# Patient Record
Sex: Male | Born: 1970 | Race: Black or African American | Hispanic: No | Marital: Married | Smoking: Current every day smoker
Health system: Southern US, Community
[De-identification: ages and names within clinical notes are randomized; demographics above are authoritative.]

## PROBLEM LIST (undated history)

## (undated) DIAGNOSIS — S82899A Other fracture of unspecified lower leg, initial encounter for closed fracture: Secondary | ICD-10-CM

## (undated) HISTORY — DX: Other fracture of unspecified lower leg, initial encounter for closed fracture: S82.899A

---

## 2011-01-27 ENCOUNTER — Emergency Department: Payer: Self-pay | Admitting: Emergency Medicine

## 2012-06-22 IMAGING — CR DG ANKLE COMPLETE 3+V*L*
1 series · 5 of 5 positions shown · non-contrast
Comparison: none

REASON FOR EXAM: trauma
COMMENTS:

PROCEDURE:     DXR - DXR ANKLE LEFT COMPLETE  - January 27, 2011  [DATE]
RESULT:     Comparison: None

[Series 1: view not recorded · 0.17mm/px · 5 of 5 slices shown]
[im 1/5]
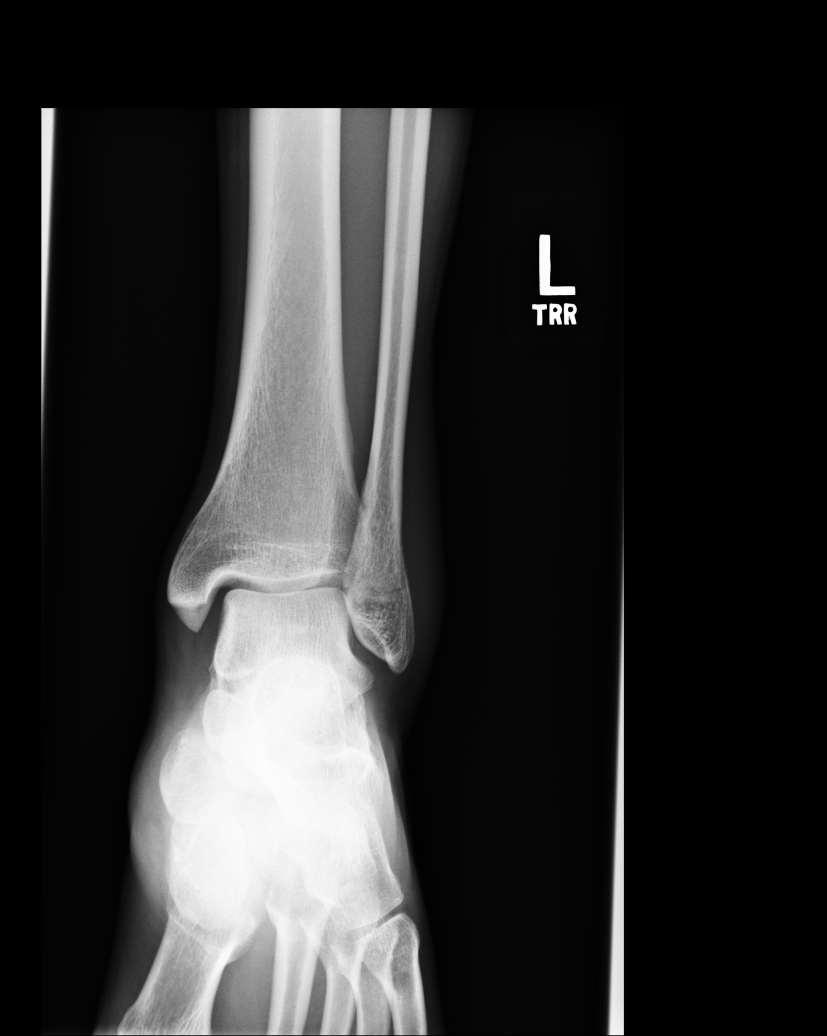
[im 2/5]
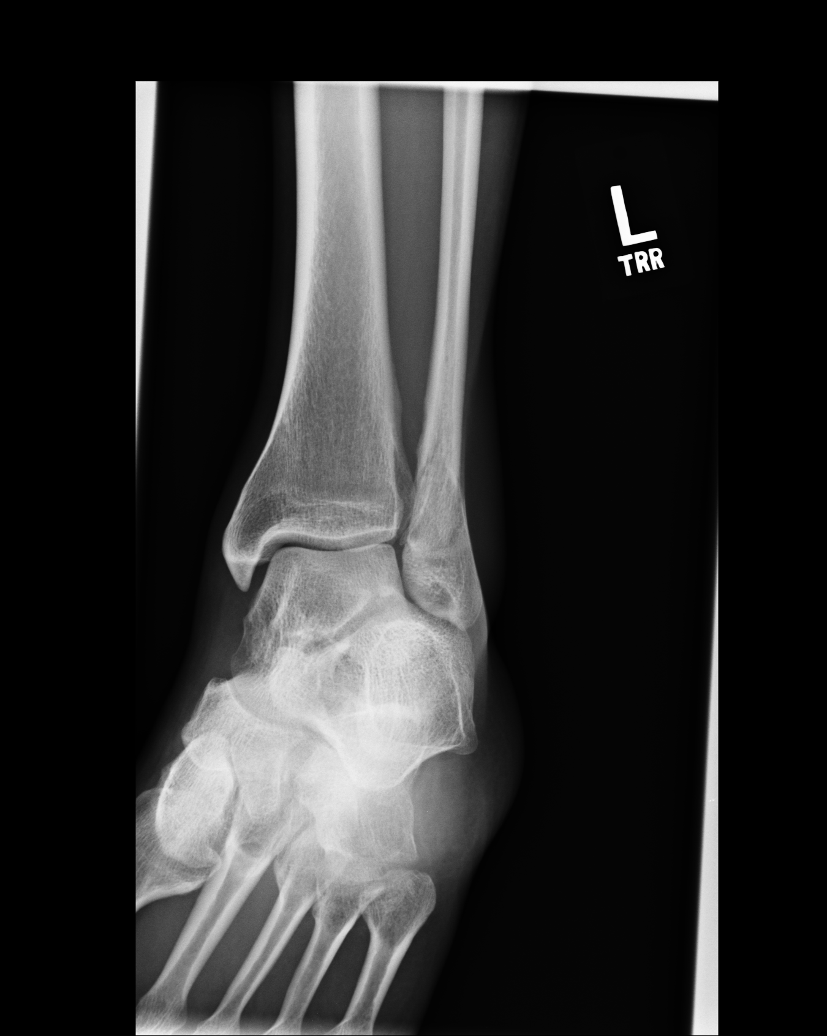
[im 3/5]
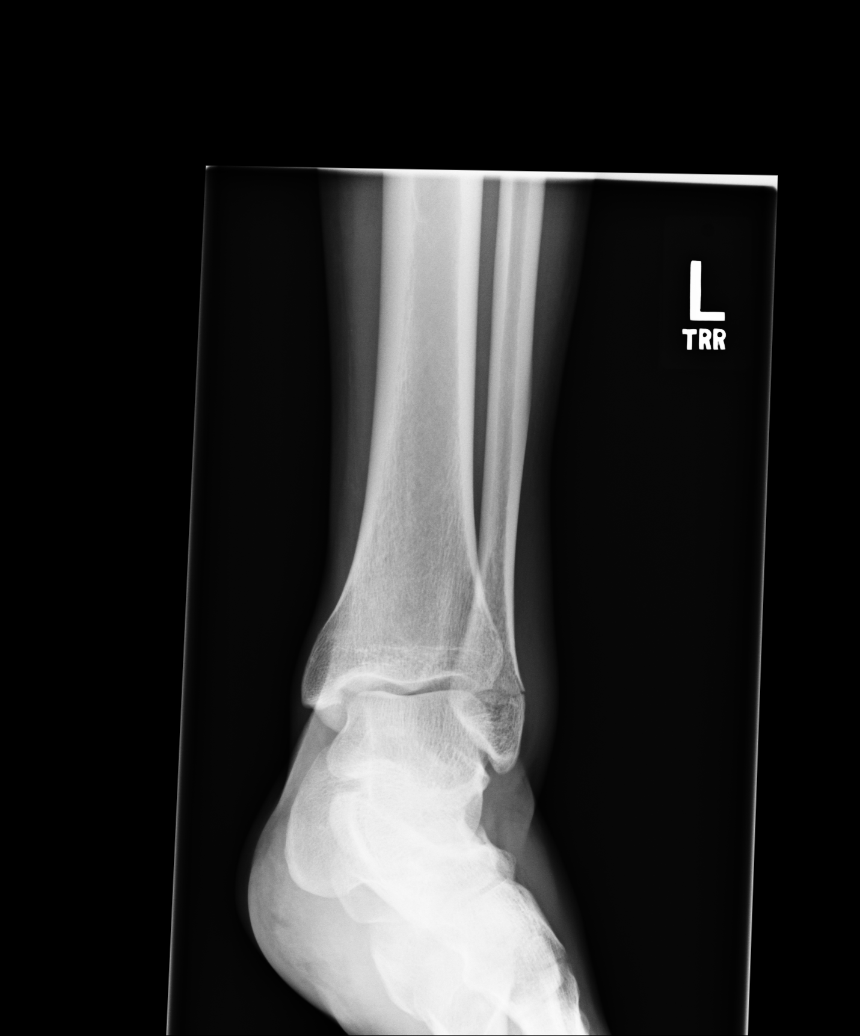
[im 4/5]
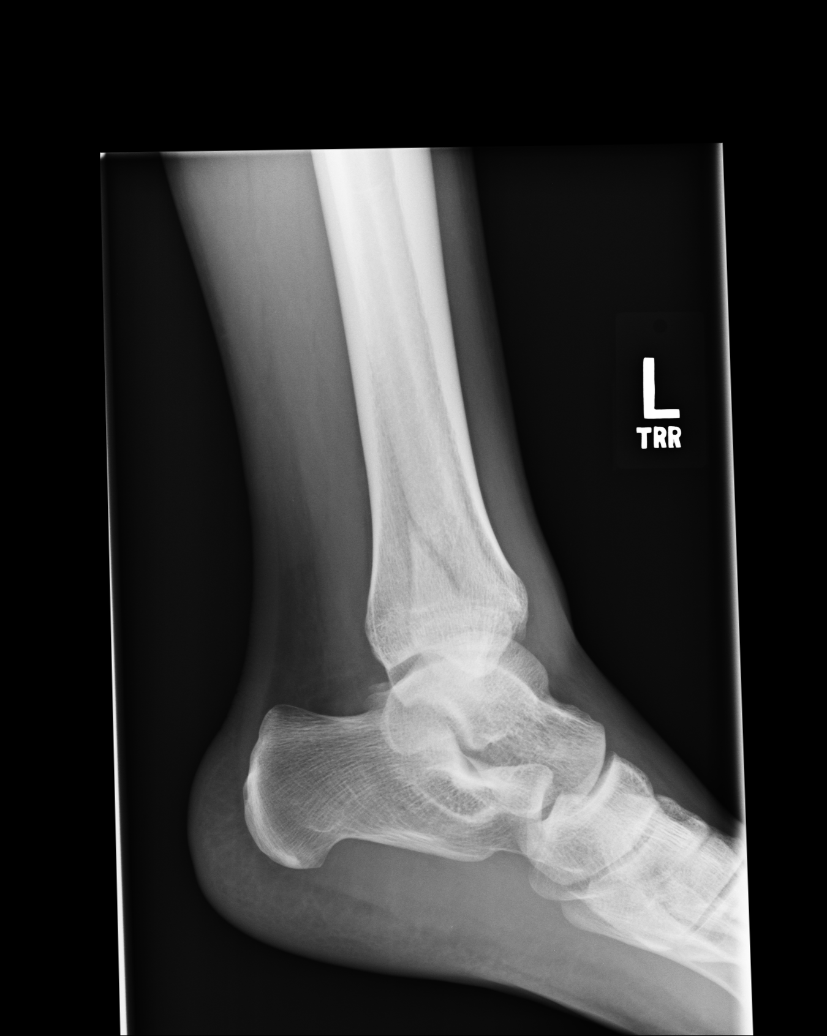
[im 5/5]
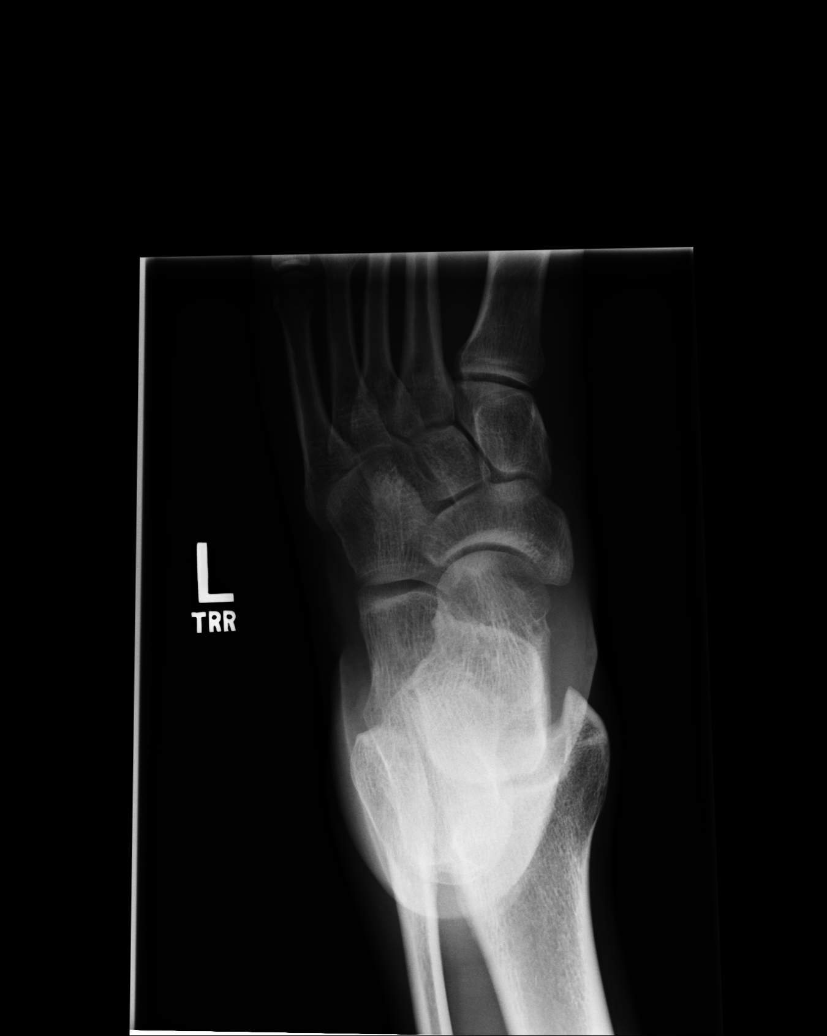

[5 of 5 positions shown; findings below may reference images not displayed]

FINDINGS: 5 views of the left ankle demonstrate a nondisplaced distal fibular fracture
and overlying soft tissue swelling. There is no other fracture or
dislocation. There ankle mortise is intact. There is no significant joint
effusion.
IMPRESSION: . Please see above.

## 2013-07-22 ENCOUNTER — Emergency Department: Payer: Self-pay | Admitting: Emergency Medicine

## 2014-09-17 ENCOUNTER — Emergency Department
Admission: EM | Admit: 2014-09-17 | Discharge: 2014-09-17 | Disposition: A | Discharge status: Home / Self Care | Payer: Self-pay | Attending: Obstetrics and Gynecology | Admitting: Obstetrics and Gynecology

## 2014-09-17 ENCOUNTER — Encounter: Payer: Self-pay | Admitting: General Practice

## 2014-09-17 DIAGNOSIS — Z720 Tobacco use: Secondary | ICD-10-CM | POA: Insufficient documentation

## 2014-09-17 DIAGNOSIS — L02416 Cutaneous abscess of left lower limb: Secondary | ICD-10-CM | POA: Insufficient documentation

## 2014-09-17 DIAGNOSIS — L02419 Cutaneous abscess of limb, unspecified: Secondary | ICD-10-CM

## 2014-09-17 DIAGNOSIS — Z880 Allergy status to penicillin: Secondary | ICD-10-CM | POA: Insufficient documentation

## 2014-09-17 MED ORDER — SULFAMETHOXAZOLE-TRIMETHOPRIM 800-160 MG PO TABS: 1.0000 | ORAL_TABLET | Freq: Two times a day (BID) | ORAL | Status: DC

## 2014-09-17 MED ORDER — HYDROCODONE-ACETAMINOPHEN 5-325 MG PO TABS: 1.0000 | ORAL_TABLET | ORAL | Status: DC | PRN

## 2014-09-17 NOTE — ED Notes (Signed)
Pt. Arrived to ED from home with reports of experiencing a abscess to back of left thigh. Pt reports site drained last night in bathtub. Denies fever at this time. Alert and Oriented.

## 2014-09-17 NOTE — ED Provider Notes (Signed)
[facility] Emergency Department Provider Note  ____________________________________________  Time seen: 1120  I have reviewed the triage vital signs and the nursing notes.   HISTORY  Chief Complaint Recurrent Skin Infections  HPI Heloise BeechamReginald M Dier is a 44 y.o. male comes today with complaint of abscess on  back this left thigh. He states it drained last night in the bathtub and a little bit this morning. He denies any fever. The last abscess he had was at least 9 or 10 years ago. He rates his pain as a 7 out of 10. Nothing has made it worse soaking in the child is actually made it better.   History reviewed. No pertinent past medical history.  There are no active problems to display for this patient.   History reviewed. No pertinent past surgical history.  Current Outpatient Rx  Name  Route  Sig  Dispense  Refill  . HYDROcodone-acetaminophen (NORCO/VICODIN) 5-325 MG per tablet   Oral   Take 1 tablet by mouth every 4 (four) hours as needed for moderate pain.   20 tablet   0   . sulfamethoxazole-trimethoprim (BACTRIM DS,SEPTRA DS) 800-160 MG per tablet   Oral   Take 1 tablet by mouth 2 (two) times daily.   20 tablet   0     Allergies Penicillins  No family history on file.  Social History History  Substance Use Topics  . Smoking status: Current Every Day Smoker -- 0.50 packs/day    Types: Cigarettes  . Smokeless tobacco: Current User  . Alcohol Use: 12.0 oz/week    20 Cans of beer per week    Review of Systems Constitutional: No fever/chills Eyes: No visual changes. ENT: No sore throat. Cardiovascular: Denies chest pain. Respiratory: Denies shortness of breath. Gastrointestinal: No abdominal pain.  No nausea, no vomiting. Genitourinary: Negative for dysuria. Musculoskeletal: Negative for back pain. Skin: Negative for rash. Neurological: Negative for headaches 10-point ROS otherwise  negative.  ____________________________________________   PHYSICAL EXAM:  VITAL SIGNS: ED Triage Vitals  Enc Vitals Group     BP 09/17/14 1105 118/76 mmHg     Pulse Rate 09/17/14 1105 70     Resp 09/17/14 1105 18     Temp 09/17/14 1105 98 F (36.7 C)     Temp Source 09/17/14 1105 Oral     SpO2 09/17/14 1105 100 %     Weight 09/17/14 1105 160 lb (72.576 kg)     Height 09/17/14 1105 5\' 10"  (1.778 m)     Head Cir --      Peak Flow --      Pain Score 09/17/14 1106 7     Pain Loc --      Pain Edu? --      Excl. in GC? --     Constitutional: Alert and oriented. Well appearing and in no acute distress. Eyes: Conjunctivae are normal. PERRL. EOMI. Head: Atraumatic. Nose: No congestion/rhinnorhea. Mouth/Throat: Mucous membranes are moist.  Neck: No stridor.    Cardiovascular: Normal rate, regular rhythm. Grossly normal heart sounds.  Good peripheral circulation. Respiratory: Normal respiratory effort.  Gastrointestinal: Soft and nontender. No distention. Musculoskeletal: No lower extremity tenderness nor edema.  No joint effusions. Neurologic:  Normal speech and language. No gross focal neurologic deficits are appreciated. Speech is normal. No gait instability. Skin:  Skin is warm, dry and intact. There is a dime-sized, warm erythematous area posterior left thigh. Skin is open there is no active draining at this time. Psychiatric:  Mood and affect are normal. Speech and behavior are normal.  ____________________________________________   LABS (all labs ordered are listed, but only abnormal results are displayed)  Labs Reviewed - No data to display  PROCEDURES  Procedure(s) performed: None  Critical Care performed: No  ____________________________________________   INITIAL IMPRESSION / ASSESSMENT AND PLAN / ED COURSE  Pertinent labs & imaging results that were available during my care of the patient were reviewed by me and considered in my medical decision making (see  chart for details).  Patient was encouraged to continue with warm soaks. He is started on antibiotics today for the next 10 days and something for pain. He was given a note for work stating he was seen here today. ____________________________________________   FINAL CLINICAL IMPRESSION(S) / ED DIAGNOSES  Final diagnoses:  Abscess of thigh      Tommi RumpsRhonda L Irmgard Rampersaud, PA-C 09/17/14 1136  Tommi Rumpshonda L Teirra Carapia, PA-C 09/17/14 1539  Phineas SemenGraydon Goodman, MD 09/22/14 [phone]

## 2015-12-01 ENCOUNTER — Ambulatory Visit (INDEPENDENT_AMBULATORY_CARE_PROVIDER_SITE_OTHER): Payer: BLUE CROSS/BLUE SHIELD | Admitting: Unknown Physician Specialty

## 2015-12-01 ENCOUNTER — Encounter: Payer: Self-pay | Admitting: Unknown Physician Specialty

## 2015-12-01 VITALS — BP 137/84 | HR 72 | Temp 98.6°F | Ht 67.7 in | Wt 147.6 lb

## 2015-12-01 DIAGNOSIS — Z0000 Encounter for general adult medical examination without abnormal findings: Secondary | ICD-10-CM | POA: Diagnosis not present

## 2015-12-01 DIAGNOSIS — Z720 Tobacco use: Secondary | ICD-10-CM | POA: Diagnosis not present

## 2015-12-01 NOTE — Patient Instructions (Addendum)
Quit smoking class [phone]   Steps to Quit Smoking  Smoking tobacco can be harmful to your health and can affect almost every organ in your body. Smoking puts you, and those around you, at risk for developing many serious chronic diseases. Quitting smoking is difficult, but it is one of the best things that you can do for your health. It is never too late to quit. WHAT ARE THE BENEFITS OF QUITTING SMOKING? When you quit smoking, you lower your risk of developing serious diseases and conditions, such as:  Lung cancer or lung disease, such as COPD.  Heart disease.  Stroke.  Heart attack.  Infertility.  Osteoporosis and bone fractures. Additionally, symptoms such as coughing, wheezing, and shortness of breath may get better when you quit. You may also find that you get sick less often because your body is stronger at fighting off colds and infections. If you are pregnant, quitting smoking can help to reduce your chances of having a baby of low birth weight. HOW DO I GET READY TO QUIT? When you decide to quit smoking, create a plan to make sure that you are successful. Before you quit:  Pick a date to quit. Set a date within the next two weeks to give you time to prepare.  Write down the reasons why you are quitting. Keep this list in places where you will see it often, such as on your bathroom mirror or in your car or wallet.  Identify the people, places, things, and activities that make you want to smoke (triggers) and avoid them. Make sure to take these actions:  Throw away all cigarettes at home, at work, and in your car.  Throw away smoking accessories, such as Set designer.  Clean your car and make sure to empty the ashtray.  Clean your home, including curtains and carpets.  Tell your family, friends, and coworkers that you are quitting. Support from your loved ones can make quitting easier.  Talk with your health care provider about your options for quitting  smoking.  Find out what treatment options are covered by your health insurance. WHAT STRATEGIES CAN I USE TO QUIT SMOKING?  Talk with your healthcare provider about different strategies to quit smoking. Some strategies include:  Quitting smoking altogether instead of gradually lessening how much you smoke over a period of time. Research shows that quitting "cold [place]" is more successful than gradually quitting.  Attending in-person counseling to help you build problem-solving skills. You are more likely to have success in quitting if you attend several counseling sessions. Even short sessions of 10 minutes can be effective.  Finding resources and support systems that can help you to quit smoking and remain smoke-free after you quit. These resources are most helpful when you use them often. They can include:  Online chats with a Veterinary surgeon.  Telephone quitlines.  Printed Materials engineer.  Support groups or group counseling.  Text messaging programs.  Mobile phone applications.  Taking medicines to help you quit smoking. (If you are pregnant or breastfeeding, talk with your health care provider first.) Some medicines contain nicotine and some do not. Both types of medicines help with cravings, but the medicines that include nicotine help to relieve withdrawal symptoms. Your health care provider may recommend:  Nicotine patches, gum, or lozenges.  Nicotine inhalers or sprays.  Non-nicotine medicine that is taken by mouth. Talk with your health care provider about combining strategies, such as taking medicines while you are also receiving in-person counseling.  Using these two strategies together makes you more likely to succeed in quitting than if you used either strategy on its own. If you are pregnant or breastfeeding, talk with your health care provider about finding counseling or other support strategies to quit smoking. Do not take medicine to help you quit smoking unless told  to do so by your health care provider. WHAT THINGS CAN I DO TO MAKE IT EASIER TO QUIT? Quitting smoking might feel overwhelming at first, but there is a lot that you can do to make it easier. Take these important actions:  Reach out to your family and friends and ask that they support and encourage you during this time. Call telephone quitlines, reach out to support groups, or work with a counselor for support.  Ask people who smoke to avoid smoking around you.  Avoid places that trigger you to smoke, such as bars, parties, or smoke-break areas at work.  Spend time around people who do not smoke.  Lessen stress in your life, because stress can be a smoking trigger for some people. To lessen stress, try:  Exercising regularly.  Deep-breathing exercises.  Yoga.  Meditating.  Performing a body scan. This involves closing your eyes, scanning your body from head to toe, and noticing which parts of your body are particularly tense. Purposefully relax the muscles in those areas.  Download or purchase mobile phone or tablet apps (applications) that can help you stick to your quit plan by providing reminders, tips, and encouragement. There are many free apps, such as QuitGuide from the [employer] developer for Disease Control and Prevention). You can find other support for quitting smoking (smoking cessation) through smokefree.gov and other websites. HOW WILL I FEEL WHEN I QUIT SMOKING? Within the first 24 hours of quitting smoking, you may start to feel some withdrawal symptoms. These symptoms are usually most noticeable 2-3 days after quitting, but they usually do not last beyond 2-3 weeks. Changes or symptoms that you might experience include:  Mood swings.  Restlessness, anxiety, or irritation.  Difficulty concentrating.  Dizziness.  Strong cravings for sugary foods in addition to nicotine.  Mild weight gain.  Constipation.  Nausea.  Coughing or a sore throat.  Changes in how your  medicines work in your body.  A depressed mood.  Difficulty sleeping (insomnia). After the first 2-3 weeks of quitting, you may start to notice more positive results, such as:  Improved sense of smell and taste.  Decreased coughing and sore throat.  Slower heart rate.  Lower blood pressure.  Clearer skin.  The ability to breathe more easily.  Fewer sick days. Quitting smoking is very challenging for most people. Do not get discouraged if you are not successful the first time. Some people need to make many attempts to quit before they achieve long-term success. Do your best to stick to your quit plan, and talk with your health care provider if you have any questions or concerns.   This information is not intended to replace advice given to you by your health care provider. Make sure you discuss any questions you have with your health care provider.   Document Released: 04/11/2001 Document Revised: 09/01/2014 Document Reviewed: 09/01/2014 Elsevier Interactive Patient Education 2016 [employer]. Smoking Hazards Smoking cigarettes is extremely bad for your health. Tobacco smoke has over 200 known poisons in it. It contains the poisonous gases nitrogen oxide and carbon monoxide. There are over 60 chemicals in tobacco smoke that cause cancer. Some of the chemicals found  in cigarette smoke include:   Cyanide.   Benzene.   Formaldehyde.   Methanol (wood alcohol).   Acetylene (fuel used in welding torches).   Ammonia.  Even smoking lightly shortens your life expectancy by several years. You can greatly reduce the risk of medical problems for you and your family by stopping now. Smoking is the most preventable cause of death and disease in our society. Within days of quitting smoking, your circulation improves, you decrease the risk of having a heart attack, and your lung capacity improves. There may be some increased phlegm in the first few days after quitting, and it may take  months for your lungs to clear up completely. Quitting for 10 years reduces your risk of developing lung cancer to almost that of a nonsmoker.  WHAT ARE THE RISKS OF SMOKING? Cigarette smokers have an increased risk of many serious medical problems, including:  Lung cancer.   Lung disease (such as pneumonia, bronchitis, and emphysema).   Heart attack and chest pain due to the heart not getting enough oxygen (angina).   Heart disease and peripheral blood vessel disease.   Hypertension.   Stroke.   Oral cancer (cancer of the lip, mouth, or voice box).   Bladder cancer.   Pancreatic cancer.   Cervical cancer.   Pregnancy complications, including premature birth.   Stillbirths and smaller newborn babies, birth defects, and genetic damage to sperm.   Early menopause.   Lower estrogen level for women.   Infertility.   Facial wrinkles.   Blindness.   Increased risk of broken bones (fractures).   Senile dementia.   Stomach ulcers and internal bleeding.   Delayed wound healing and increased risk of complications during surgery. Because of secondhand smoke exposure, children of smokers have an increased risk of the following:   Sudden infant death syndrome (SIDS).   Respiratory infections.   Lung cancer.   Heart disease.   Ear infections.  WHY IS SMOKING ADDICTIVE? Nicotine is the chemical agent in tobacco that is capable of causing addiction or dependence. When you smoke and inhale, nicotine is absorbed rapidly into the bloodstream through your lungs. Both inhaled and noninhaled nicotine may be addictive.  WHAT ARE THE BENEFITS OF QUITTING?  There are many health benefits to quitting smoking. Some are:   The likelihood of developing cancer and heart disease decreases. Health improvements are seen almost immediately.   Blood pressure, pulse rate, and breathing patterns start returning to normal soon after quitting.   People who quit  may see an improvement in their overall quality of life.  HOW DO YOU QUIT SMOKING? Smoking is an addiction with both physical and psychological effects, and longtime habits can be hard to change. Your health care provider can recommend:  Programs and community resources, which may include group support, education, or therapy.  Replacement products, such as patches, gum, and nasal sprays. Use these products only as directed. Do not replace cigarette smoking with electronic cigarettes (commonly called e-cigarettes). The safety of e-cigarettes is unknown, and some may contain harmful chemicals. FOR MORE INFORMATION  American Lung Association: www.lung.org  American Cancer Society: www.cancer.org   This information is not intended to replace advice given to you by your health care provider. Make sure you discuss any questions you have with your health care provider.   Document Released: 05/25/2004 Document Revised: 02/05/2013 Document Reviewed: 10/07/2012 Elsevier Interactive Patient Education [employer]

## 2015-12-01 NOTE — Progress Notes (Signed)
BP 137/84 (BP Location: Left Arm, Patient Position: Sitting, Cuff Size: Large)   Pulse 72   Temp 98.6 F (37 C)   Ht 5' 7.7" (1.72 m)   Wt 147 lb 9.6 oz (67 kg)   SpO2 98%   BMI 22.64 kg/m    Subjective:    Patient ID: George Woods, male    DOB: 1970-08-15, 45 y.o.   MRN: [id number]  HPI: George Woods is a 45 y.o. male  Chief Complaint  Patient presents with  . Establish Care   Pt states "I'm fine" and his wife pushed him in to come.    Social History   Social History  . Marital status: Married    Spouse name: N/A  . Number of children: N/A  . Years of education: N/A   Occupational History  . Not on file.   Social History Main Topics  . Smoking status: Current Every Day Smoker    Packs/day: 0.50    Types: Cigarettes  . Smokeless tobacco: Current User    Types: Chew  . Alcohol use 12.0 oz/week    20 Cans of beer per week  . Drug use: No  . Sexual activity: Yes   Other Topics Concern  . Not on file   Social History Narrative  . No narrative on file   Family History  Problem Relation Age of Onset  . Diabetes Father   . Liver disease Maternal Grandfather   . Alzheimer's disease Paternal Grandmother   . Cancer Paternal Grandfather    Past Medical History:  Diagnosis Date  . Broken ankle    History reviewed. No pertinent surgical history.   Relevant past medical, surgical, family and social history reviewed and updated as indicated. Interim medical history since our last visit reviewed. Allergies and medications reviewed and updated.  Review of Systems  Constitutional: Negative.   HENT: Negative.   Eyes: Negative.   Respiratory: Negative.   Cardiovascular: Negative.   Gastrointestinal: Negative.   Endocrine: Negative.   Genitourinary: Negative.   Skin: Negative.   Allergic/Immunologic: Negative.   Neurological: Negative.   Hematological: Negative.   Psychiatric/Behavioral: Negative.     Per HPI unless specifically indicated  above     Objective:    BP 137/84 (BP Location: Left Arm, Patient Position: Sitting, Cuff Size: Large)   Pulse 72   Temp 98.6 F (37 C)   Ht 5' 7.7" (1.72 m)   Wt 147 lb 9.6 oz (67 kg)   SpO2 98%   BMI 22.64 kg/m   Wt Readings from Last 3 Encounters:  12/01/15 147 lb 9.6 oz (67 kg)  09/17/14 160 lb (72.6 kg)    Physical Exam  Constitutional: He is oriented to person, place, and time. He appears well-developed and well-nourished.  HENT:  Head: Normocephalic.  Right Ear: Tympanic membrane, external ear and ear canal normal.  Left Ear: Tympanic membrane, external ear and ear canal normal.  Mouth/Throat: Uvula is midline, oropharynx is clear and moist and mucous membranes are normal.  Eyes: Pupils are equal, round, and reactive to light.  Cardiovascular: Normal rate, regular rhythm and normal heart sounds.  Exam reveals no gallop and no friction rub.   No murmur heard. Pulmonary/Chest: Effort normal and breath sounds normal. No respiratory distress.  Abdominal: Soft. Bowel sounds are normal. He exhibits no distension. There is no tenderness.  Musculoskeletal: Normal range of motion.  Neurological: He is alert and oriented to person, place, and time. He has  normal reflexes.  Skin: Skin is warm and dry.  Psychiatric: He has a normal mood and affect. His behavior is normal. Judgment and thought content normal.    No results found for this or any previous visit.    Assessment & Plan:   Problem List Items Addressed This Visit      Unprioritized   Tobacco abuse    Counseling.  Information given       Other Visit Diagnoses    Routine general medical examination at a health care facility    -  Primary   Relevant Orders   CBC with Differential/Platelet   Comprehensive metabolic panel   Lipid Panel w/o Chol/HDL Ratio   TSH       Follow up plan: Return in about 1 year (around 11/30/2016).

## 2015-12-01 NOTE — Assessment & Plan Note (Signed)
Counseling.  Information given

## 2015-12-02 LAB — LIPID PANEL W/O CHOL/HDL RATIO
Cholesterol, Total: 154 mg/dL (ref 100–199)
HDL: 65 mg/dL (ref >39)
LDL Calculated: 68 mg/dL (ref 0–99)
Triglycerides: 104 mg/dL (ref 0–149)
VLDL Cholesterol Cal: 21 mg/dL (ref 5–40)

## 2015-12-02 LAB — COMPREHENSIVE METABOLIC PANEL
A/G RATIO: 1.8 (ref 1.2–2.2)
ALBUMIN: 4.8 g/dL (ref 3.5–5.5)
ALK PHOS: 61 IU/L (ref 39–117)
ALT: 14 IU/L (ref 0–44)
AST: 25 IU/L (ref 0–40)
BUN / CREAT RATIO: 7 — AB (ref 9–20)
BUN: 7 mg/dL (ref 6–24)
Bilirubin Total: 0.5 mg/dL (ref 0.0–1.2)
CHLORIDE: 99 mmol/L (ref 96–106)
CO2: 22 mmol/L (ref 18–29)
Calcium: 9.5 mg/dL (ref 8.7–10.2)
Creatinine, Ser: 0.95 mg/dL (ref 0.76–1.27)
GFR calc Af Amer: 114 mL/min/{1.73_m2} (ref >59)
GFR calc non Af Amer: 98 mL/min/{1.73_m2} (ref >59)
GLOBULIN, TOTAL: 2.7 g/dL (ref 1.5–4.5)
GLUCOSE: 73 mg/dL (ref 65–99)
Potassium: 3.8 mmol/L (ref 3.5–5.2)
SODIUM: 138 mmol/L (ref 134–144)
Total Protein: 7.5 g/dL (ref 6.0–8.5)

## 2015-12-02 LAB — CBC WITH DIFFERENTIAL/PLATELET
BASOS ABS: 0.1 10*3/uL (ref 0.0–0.2)
Basos: 1 %
EOS (ABSOLUTE): 0.2 10*3/uL (ref 0.0–0.4)
Eos: 2 %
HEMOGLOBIN: 12.7 g/dL (ref 12.6–17.7)
Hematocrit: 37 % — ABNORMAL LOW (ref 37.5–51.0)
Immature Grans (Abs): 0 10*3/uL (ref 0.0–0.1)
Immature Granulocytes: 0 %
Lymphocytes Absolute: 3.5 10*3/uL — ABNORMAL HIGH (ref 0.7–3.1)
Lymphs: 33 %
MCH: 34.4 pg — AB (ref 26.6–33.0)
MCHC: 34.3 g/dL (ref 31.5–35.7)
MCV: 100 fL — ABNORMAL HIGH (ref 79–97)
MONOCYTES: 8 %
Monocytes Absolute: 0.9 10*3/uL (ref 0.1–0.9)
NEUTROS ABS: 6.1 10*3/uL (ref 1.4–7.0)
Neutrophils: 56 %
Platelets: 272 10*3/uL (ref 150–379)
RBC: 3.69 x10E6/uL — ABNORMAL LOW (ref 4.14–5.80)
RDW: 14.3 % (ref 12.3–15.4)
WBC: 10.8 10*3/uL (ref 3.4–10.8)

## 2015-12-02 LAB — TSH: TSH: 0.856 u[IU]/mL (ref 0.450–4.500)

## 2015-12-03 ENCOUNTER — Other Ambulatory Visit: Payer: Self-pay | Admitting: Unknown Physician Specialty

## 2015-12-03 DIAGNOSIS — D7589 Other specified diseases of blood and blood-forming organs: Secondary | ICD-10-CM | POA: Insufficient documentation

## 2015-12-06 ENCOUNTER — Telehealth: Payer: Self-pay | Admitting: Unknown Physician Specialty

## 2015-12-06 NOTE — Telephone Encounter (Signed)
Pt called stated he is returning a call from us. Thanks.

## 2015-12-06 NOTE — Telephone Encounter (Signed)
Called patient back. George MaxwellCheryl had sent me a result note with lab results and I was calling patient about that. Called him back and let him know about labs.

## 2016-01-28 ENCOUNTER — Telehealth: Payer: Self-pay

## 2016-01-28 NOTE — Telephone Encounter (Signed)
Called and let patient know that Elnita MaxwellCheryl wanted to draw a B12 lab on him. Patient stated he would come by after work if he got off in time.

## 2016-01-28 NOTE — Telephone Encounter (Signed)
-----   Message from Gabriel Cirriheryl Wicker, NP sent at 01/24/2016  1:16 PM EDT ----- Let him know I need a B12 test ----- Message ----- From: SYSTEM Sent: 01/08/2016  12:05 AM To: Gabriel Cirriheryl Wicker, NP

## 2016-03-06 ENCOUNTER — Ambulatory Visit
Admission: RE | Admit: 2016-03-06 | Discharge: 2016-03-06 | Disposition: A | Discharge status: Home / Self Care | Payer: BLUE CROSS/BLUE SHIELD | Source: Ambulatory Visit | Attending: Family Medicine | Admitting: Family Medicine

## 2016-03-06 ENCOUNTER — Encounter: Payer: Self-pay | Admitting: Family Medicine

## 2016-03-06 ENCOUNTER — Telehealth: Payer: Self-pay | Admitting: Family Medicine

## 2016-03-06 ENCOUNTER — Ambulatory Visit (INDEPENDENT_AMBULATORY_CARE_PROVIDER_SITE_OTHER): Payer: BLUE CROSS/BLUE SHIELD | Admitting: Family Medicine

## 2016-03-06 VITALS — BP 140/90 | HR 62 | Temp 98.5°F | Wt 146.0 lb

## 2016-03-06 DIAGNOSIS — M25572 Pain in left ankle and joints of left foot: Secondary | ICD-10-CM | POA: Diagnosis present

## 2016-03-06 MED ORDER — PREDNISONE 20 MG PO TABS: 40.0000 mg | ORAL_TABLET | Freq: Every day | ORAL | 0 refills | Status: DC

## 2016-03-06 NOTE — Patient Instructions (Signed)
We will call you as soon as we get your x-ray results back to figure out how to proceed from here.

## 2016-03-06 NOTE — Telephone Encounter (Signed)
Patient notified

## 2016-03-06 NOTE — Progress Notes (Signed)
   BP 140/90   Pulse 62   Temp 98.5 F (36.9 C)   Wt 146 lb (66.2 kg)   SpO2 100%   BMI 22.40 kg/m    Subjective:    Patient ID: George Woods, male    DOB: [birth year], 45 y.o.   MRN: [id number]  HPI: George Woods is a 45 y.o. male  Chief Complaint  Patient presents with  . Ankle Pain    left ankle, started yesterday morning, sharp pain, wakes him up, hurts to walk or apply any pressure. No current injury but did break that foot several years ago.   Left ankle pain x 1 day. Woke up yesterday with sharp left ankle pain, especially upon plantar flexion. No known injury or irregular movements lately. Fractured this ankle in 2012 but has had no issues since. No hardware or surgery required at that time. Denies redness, swelling, bruising to the ankle. Denies any other issues with joint pains. Has not been taking anything OTC for the pain. Father has hx of gout but pt has never had any issues before.   Relevant past medical, surgical, family and social history reviewed and updated as indicated. Interim medical history since our last visit reviewed. Allergies and medications reviewed and updated.  Review of Systems  Constitutional: Negative.  Negative for fever.  HENT: Negative.   Respiratory: Negative.   Cardiovascular: Negative.   Gastrointestinal: Negative.   Genitourinary: Negative.   Musculoskeletal: Positive for arthralgias.  Skin: Negative.  Negative for color change and wound.  Neurological: Negative.  Negative for weakness and numbness.  Psychiatric/Behavioral: Negative.     Per HPI unless specifically indicated above     Objective:    BP 140/90   Pulse 62   Temp 98.5 F (36.9 C)   Wt 146 lb (66.2 kg)   SpO2 100%   BMI 22.40 kg/m   Wt Readings from Last 3 Encounters:  03/06/16 146 lb (66.2 kg)  12/01/15 147 lb 9.6 oz (67 kg)  09/17/14 160 lb (72.6 kg)    Physical Exam  Constitutional: He is oriented to person, place, and time. He appears  well-developed and well-nourished.  HENT:  Head: Atraumatic.  Eyes: Conjunctivae are normal. No scleral icterus.  Neck: Normal range of motion. Neck supple.  Cardiovascular: Normal rate, regular rhythm, normal heart sounds and intact distal pulses.   Pulmonary/Chest: Effort normal. No respiratory distress.  Musculoskeletal: Normal range of motion. He exhibits no edema or tenderness.  Pain with passive and active plantar flexion. Antalgic gait   Neurological: He is alert and oriented to person, place, and time.  Neurovascularly intact  Skin: Skin is warm and dry. No erythema.  Psychiatric: He has a normal mood and affect. His behavior is normal.  Nursing note and vitals reviewed.       Assessment & Plan:   Problem List Items Addressed This Visit    None    Visit Diagnoses    Acute left ankle pain    -  Primary   Very low suspicion for gout, but will r/o with a uric acid level. Likely arthritis from previous injury. Will obtain x-ray of ankle and proceed from there   Relevant Orders   DG Ankle Complete Left (Completed)   Uric acid       Follow up plan: Return if symptoms worsen or fail to improve.

## 2016-03-06 NOTE — Telephone Encounter (Signed)
Please call pt and let him know his x-ray came back normal. Likely just some arthritis due to changing weather and the old injury, try to rest and sent over prednisone for 5 days to take in the mornings. Once completed, can take tylenol or ibuprofen as needed for pain.

## 2016-03-07 ENCOUNTER — Encounter: Payer: Self-pay | Admitting: Family Medicine

## 2016-03-07 LAB — URIC ACID: Uric Acid: 5.7 mg/dL (ref 3.7–8.6)

## 2016-03-21 NOTE — Progress Notes (Signed)
Letter generated and sent to patient.  

## 2017-03-07 ENCOUNTER — Ambulatory Visit: Payer: Self-pay

## 2017-03-07 ENCOUNTER — Telehealth: Payer: Self-pay | Admitting: Family Medicine

## 2017-03-07 ENCOUNTER — Ambulatory Visit: Payer: BLUE CROSS/BLUE SHIELD | Admitting: Family Medicine

## 2017-03-07 NOTE — Telephone Encounter (Addendum)
Triaged pt. with report of sustaining a dog bite this morning about 10:00 AM on right arm, near the wrist.  Reported the dog bite is approx. diameter of an ink pen.  Stated it was bleeding, but now the bleeding has decreased, and is slowly oozing.  Reported there is a deep puncture from the bite; stated I can see the white meat and it almost went to the bone."  Reported that he cleaned it with H202 and applied Neosporin.  Also stated he has elevated the arm.  Unsure when last Tetanus shot was.  Reported that the dog was up to date with Rabies vaccine.  Advised that he should go to the ER.  Pt. Stated he called 911, but they only sent out [employer].  Stated he didn't want to go to the ER, and has no transportation.    [facility]  Was advised that the PA will see the pt. In the office today; made an appt. At 3:30 PM today.  Pt. Advised to keep the wound clean, cover with gauze and to elevate the arm.  Reported he has no gauze; advised to wrap in clean kitchen towel or hand towel.  Pt. verb. understanding.; agreed with appt. Today.             Reason for Disposition . [1] Cut (length > 1/8 inch or 3 mm) or skin tear AND[2] any animal  Answer Assessment - Initial Assessment Questions 1. ANIMAL: "What type of animal caused the bite?" "Is the injury from a bite or a claw?" If the animal is a dog or a cat, ask: "Was it a pet or a stray?" "Was it acting ill or behaving strangely?"     His dog; up to date with rabies shots  2. LOCATION: "Where is the bite located?"      Right forearm near wrist 3. SIZE: "How big is the bite?" "What does it look like?"      Open area approx. Diameter of ink pen; very deep, swelling and pain 4. ONSET: "When did the bite happen?" (Minutes or hours ago)      10:00 AM 5. CIRCUMSTANCES: "Tell me how this happened."      Dog reacted to someone knocking at the door and bit his owner  6. TETANUS: "When was the last tetanus booster?"  Unknown;  7. PREGNANCY: "Is there any chance you are pregnant?" "When was your last menstrual period?"     n/a  Protocols used: ANIMAL BITE-A-AH

## 2017-07-31 IMAGING — DX DG ANKLE COMPLETE 3+V*L*
3 series · 3 of 3 positions shown · non-contrast
Comparison: 01/27/2011.

CLINICAL DATA: Ankle pain.  No new injury.

EXAM:
LEFT ANKLE COMPLETE - 3+ VIEW

[ankle ap]
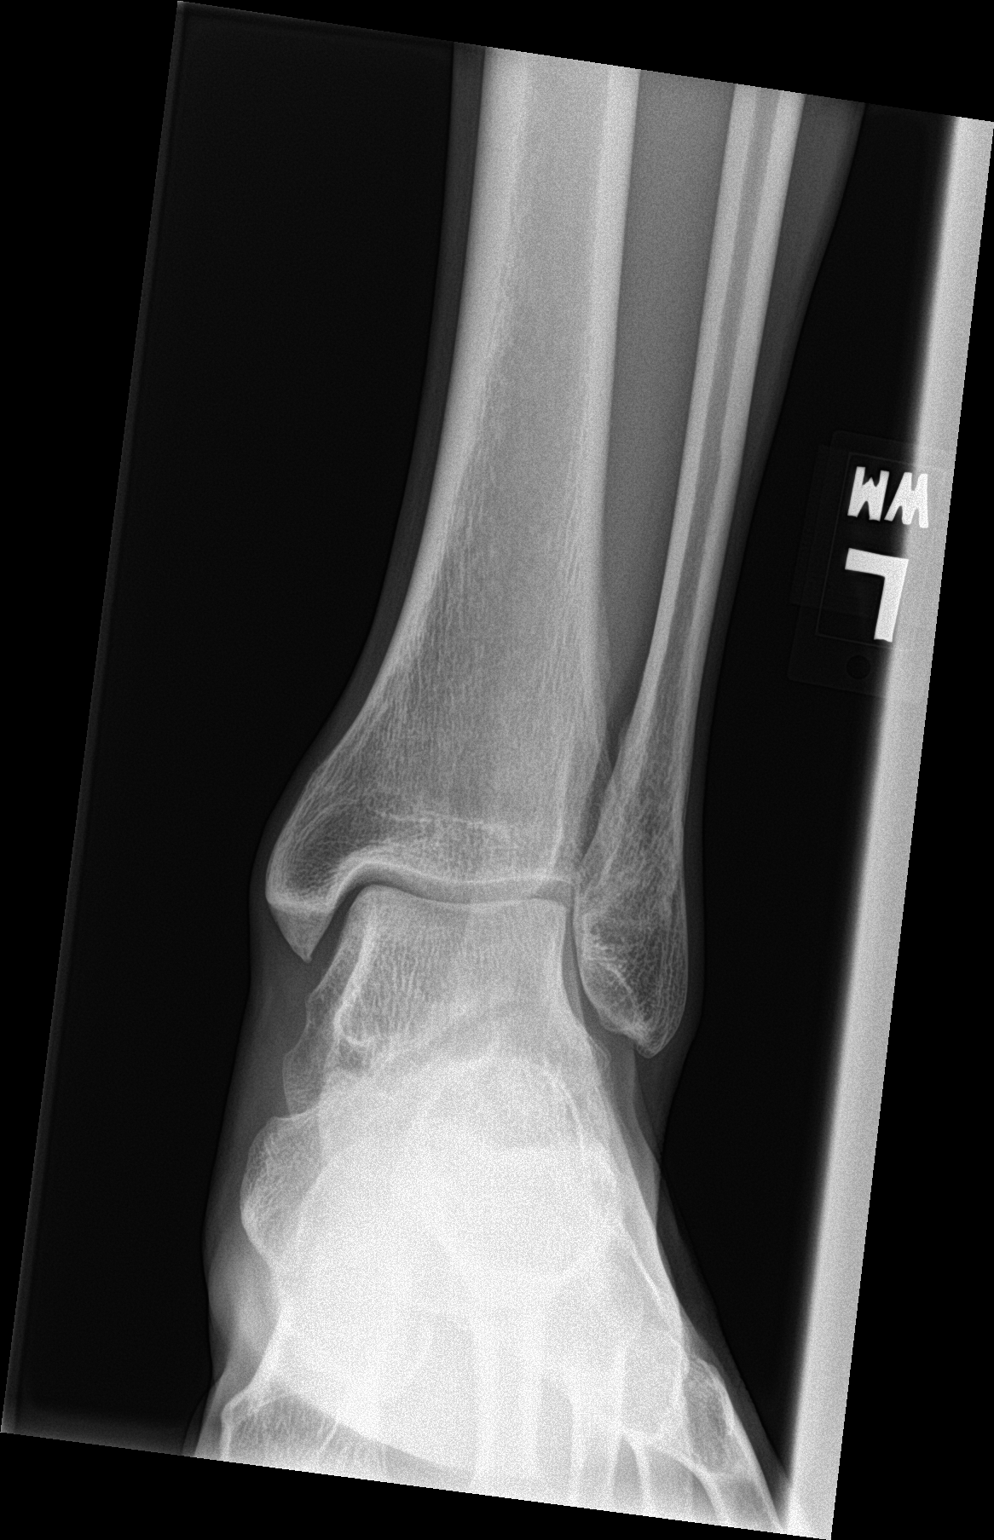

[ankle obl]
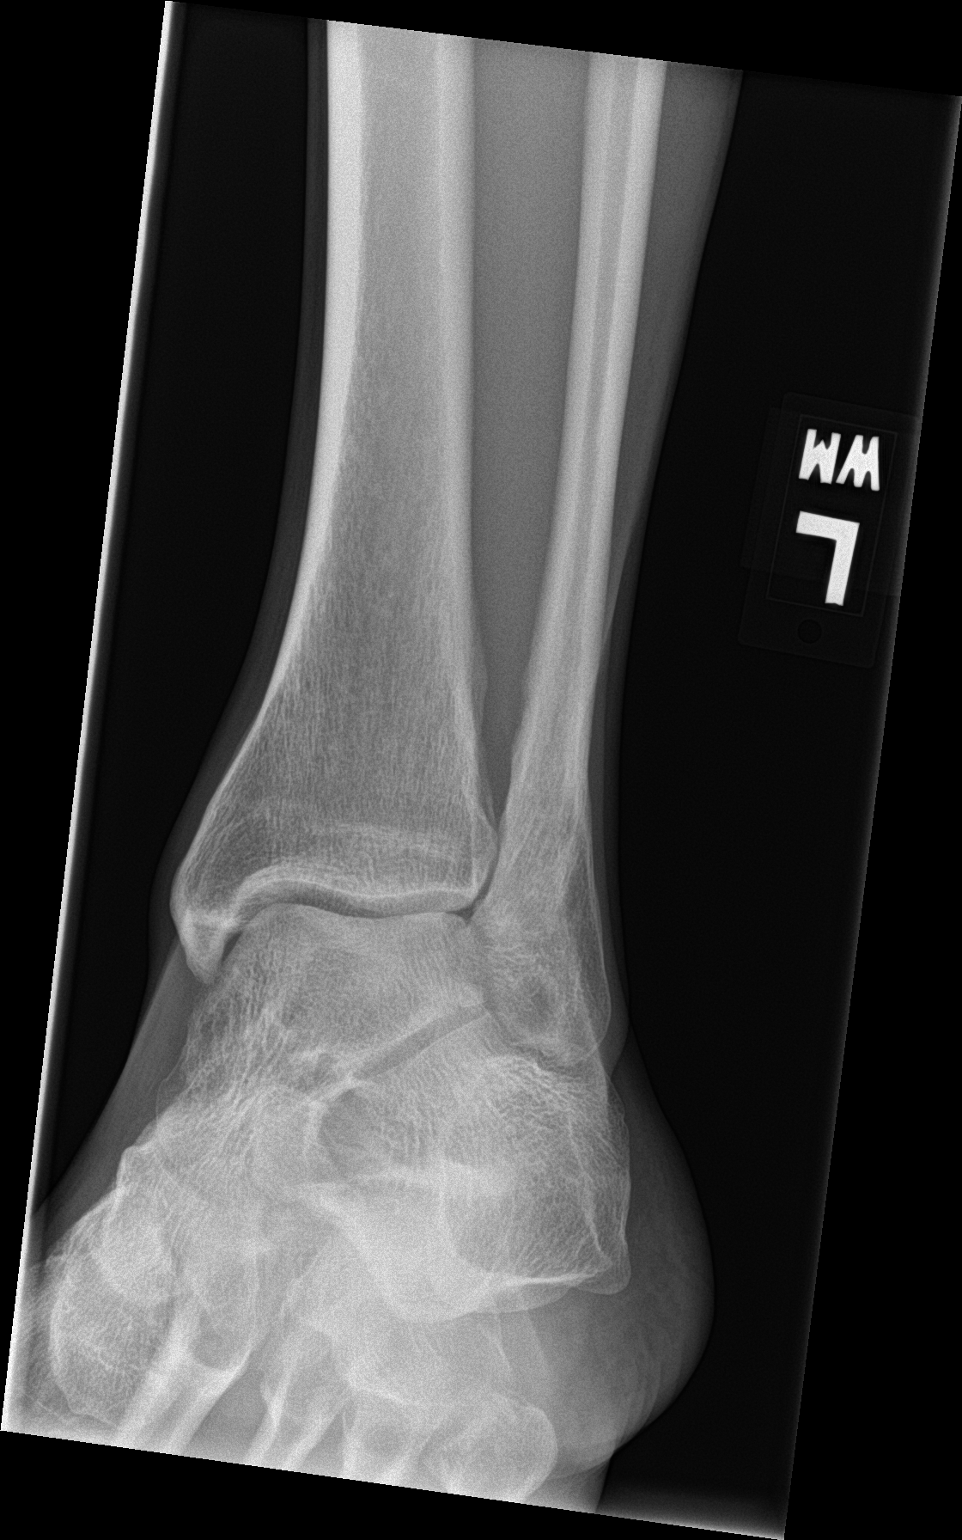

[ankle lat]
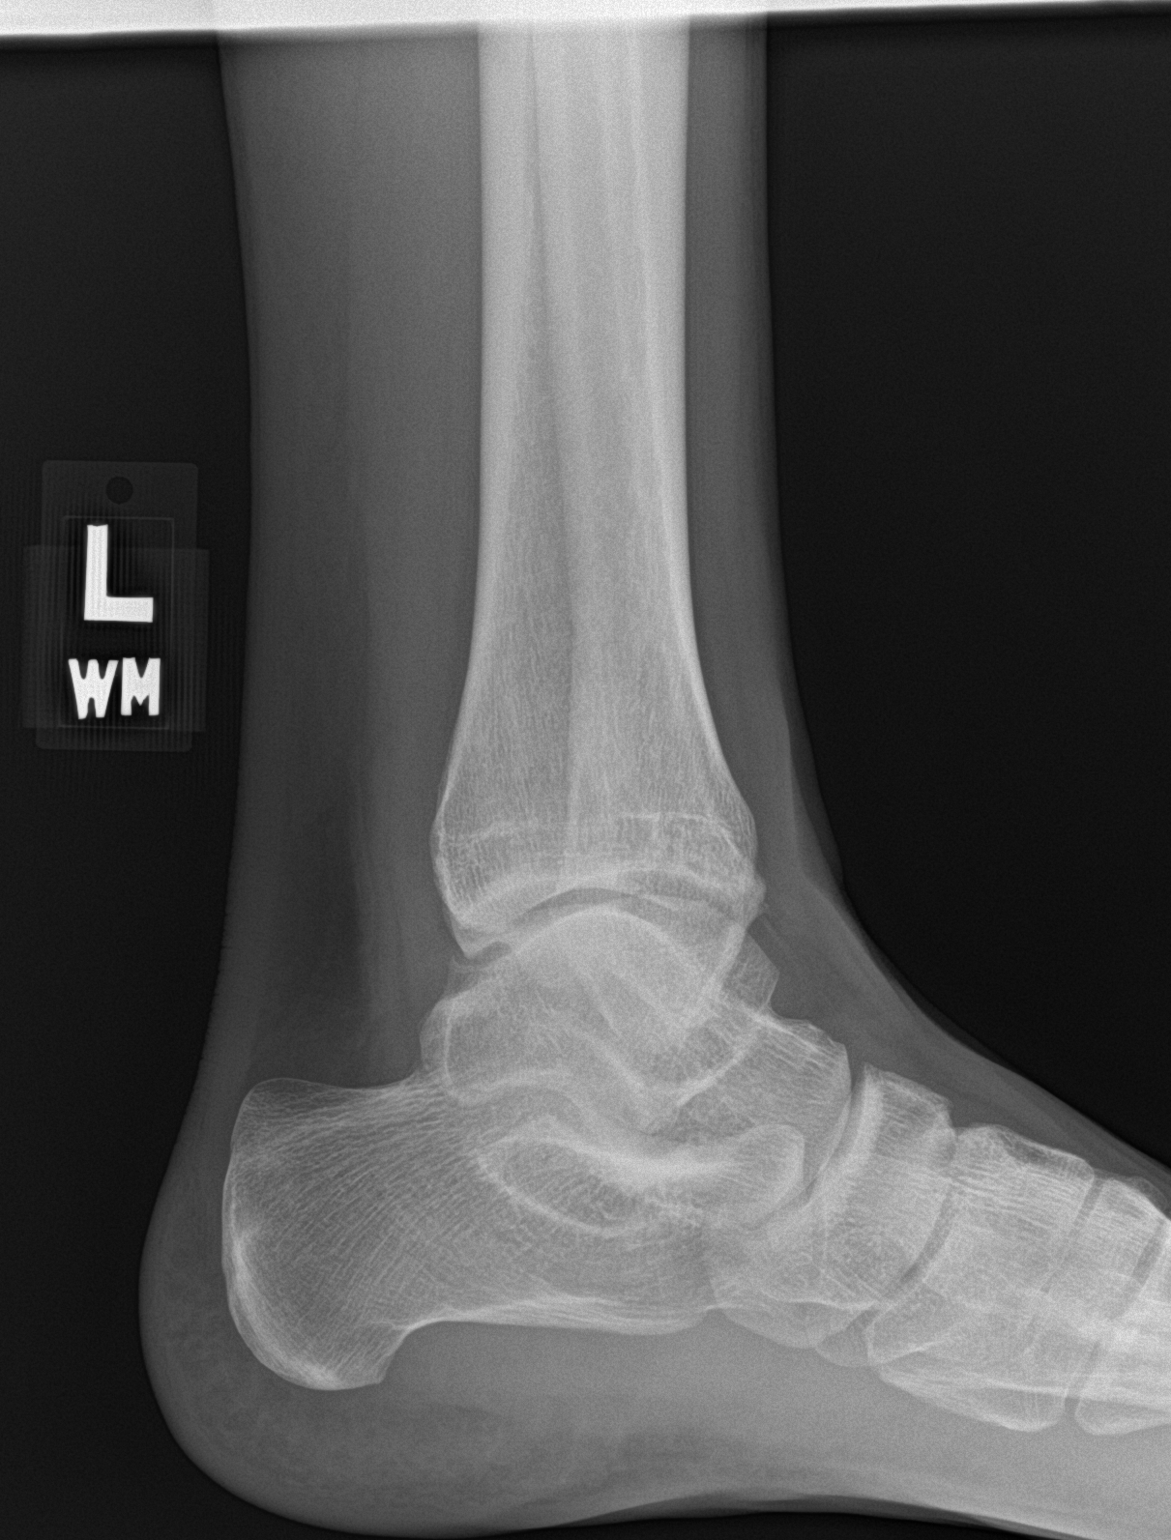

[3 of 3 positions shown; findings below may reference images not displayed]

FINDINGS: No acute bony or joint abnormality identified. No evidence of
fracture dislocation.
IMPRESSION: No acute or focal abnormality.

## 2021-05-27 ENCOUNTER — Ambulatory Visit: Payer: Self-pay

## 2021-05-27 NOTE — Telephone Encounter (Signed)
Pt use to go to [facility].  He has noticed a knot on his breast near his nipple that is tender.  He is wanting an appt to come back in but to be re-established it would be May.  Please advise   Chief Complaint: Lump right breast, golf ball size Symptoms: Tender Frequency: Noticed it 1 month ago Pertinent Negatives: Patient denies any other symptoms Disposition: [] ED /[] Urgent Care (no appt availability in office) / [] Appointment(In office/virtual)/ []  [facility]/ [] Home Care/ [] Refused Recommended Disposition /[] [facility]/ []  Follow-up with PCP Additional Notes: Pt. Needs to re-establish and is asking to be worked in. Spoke with in the practice and will send triage for review.   Answer Assessment - Initial Assessment Questions 1. SYMPTOM: "What's the main symptom you're concerned about?"  (e.g., lump, pain, rash, nipple discharge)     Lump 2. LOCATION: "Where is the lump located?"     Right 3. ONSET: "When did lump  start?"     1 month ago 4. PRIOR HISTORY: "Do you have any history of prior problems with your breasts?" (e.g., lumps, cancer, fibrocystic breast disease)     No 5. CAUSE: "What do you think is causing this symptom?"     Unsure 6. OTHER SYMPTOMS: "Do you have any other symptoms?" (e.g., fever, breast pain, redness or rash, nipple discharge)     No 7. PREGNANCY-BREASTFEEDING: "Is there any chance you are pregnant?" "When was your last menstrual period?" "Are you breastfeeding?"     N/a  Protocols used: Breast Symptoms-A-AH

## 2021-05-27 NOTE — Telephone Encounter (Signed)
Appt scheduled

## 2021-05-27 NOTE — Telephone Encounter (Signed)
Will need to wait until next new patient appt.

## 2021-10-05 ENCOUNTER — Ambulatory Visit: Payer: Self-pay | Admitting: Nurse Practitioner

## 2023-12-20 ENCOUNTER — Encounter: Payer: Self-pay | Admitting: Nurse Practitioner

## 2023-12-20 ENCOUNTER — Ambulatory Visit: Payer: Self-pay | Admitting: Nurse Practitioner

## 2023-12-20 VITALS — BP 132/78 | HR 76 | Temp 98.3°F | Resp 15 | Ht 67.72 in | Wt 142.4 lb

## 2023-12-20 DIAGNOSIS — Z1211 Encounter for screening for malignant neoplasm of colon: Secondary | ICD-10-CM | POA: Diagnosis not present

## 2023-12-20 DIAGNOSIS — Z720 Tobacco use: Secondary | ICD-10-CM | POA: Diagnosis not present

## 2023-12-20 DIAGNOSIS — Z7689 Persons encountering health services in other specified circumstances: Secondary | ICD-10-CM

## 2023-12-20 NOTE — Assessment & Plan Note (Signed)
 Discussed cessation and available treatment including nicotine replacement options, pharmacologic treatment, and/or online resources. Based on our discussion, he does not plan initiate treatment today. We discussed possible initiation of tx w/ Nicotine Gum and inhaler and patches. Total time spent on discussion: 7 minutes.

## 2023-12-20 NOTE — Progress Notes (Signed)
 BP 132/78 (BP Location: Left Arm, Patient Position: Sitting, Cuff Size: Normal)   Pulse 76   Temp 98.3 F (36.8 C) (Oral)   Resp 15   Ht 5' 7.72 (1.72 m)   Wt 142 lb 6.4 oz (64.6 kg)   SpO2 97%   BMI 21.83 kg/m    Subjective:    Patient ID: George Woods, male    DOB: 08/24/1970, 53 y.o.   MRN: [id number]  HPI: George Woods is a 53 y.o. male  Chief Complaint  Patient presents with   Establish Care    Previous patient but its been a long time. No current concerns.    Nicotine Dependence    Is interested in quitting.    Patient presents to clinic to establish care with new PCP.  Introduced to Publishing rights manager role and practice setting.  All questions answered.  Discussed provider/patient relationship and expectations.  Patient reports a history of current everyday smoker.  Denies any past surgical history.    Patient denies a history of: Hypertension, Elevated Cholesterol, Diabetes, Thyroid problems, Depression, Anxiety, Neurological problems, and Abdominal problems.   SMOKING CESSATION Smoking Status: currently smoking Smoking Amount: 1ppd Smoking Onset:  Smoking Quit Date:  Smoking triggers: Type of tobacco use: cigarettes Treatments attempted: Gum Pneumovax: refused   Active Ambulatory Problems    Diagnosis Date Noted   Tobacco abuse 12/01/2015   Macrocytosis 12/03/2015   Resolved Ambulatory Problems    Diagnosis Date Noted   No Resolved Ambulatory Problems   Past Medical History:  Diagnosis Date   Broken ankle    History reviewed. No pertinent surgical history. Family History  Problem Relation Age of Onset   Diabetes Father    Liver disease Maternal Grandfather    Alzheimer's disease Paternal Grandmother    Cancer Paternal Grandfather      Review of Systems  All other systems reviewed and are negative.   Per HPI unless specifically indicated above     Objective:    BP 132/78 (BP Location: Left Arm, Patient Position: Sitting,  Cuff Size: Normal)   Pulse 76   Temp 98.3 F (36.8 C) (Oral)   Resp 15   Ht 5' 7.72 (1.72 m)   Wt 142 lb 6.4 oz (64.6 kg)   SpO2 97%   BMI 21.83 kg/m   Wt Readings from Last 3 Encounters:  12/20/23 142 lb 6.4 oz (64.6 kg)  03/06/16 146 lb (66.2 kg)  12/01/15 147 lb 9.6 oz (67 kg)    Physical Exam Vitals and nursing note reviewed.  Constitutional:      General: He is not in acute distress.    Appearance: Normal appearance. He is not ill-appearing, toxic-appearing or diaphoretic.  HENT:     Head: Normocephalic.     Right Ear: External ear normal.     Left Ear: External ear normal.     Nose: Nose normal. No congestion or rhinorrhea.     Mouth/Throat:     Mouth: Mucous membranes are moist.  Eyes:     General:        Right eye: No discharge.        Left eye: No discharge.     Extraocular Movements: Extraocular movements intact.     Conjunctiva/sclera: Conjunctivae normal.     Pupils: Pupils are equal, round, and reactive to light.  Cardiovascular:     Rate and Rhythm: Normal rate and regular rhythm.     Heart sounds: No murmur heard. Pulmonary:  Effort: Pulmonary effort is normal. No respiratory distress.     Breath sounds: Normal breath sounds. No wheezing, rhonchi or rales.  Abdominal:     General: Abdomen is flat. Bowel sounds are normal.  Musculoskeletal:     Cervical back: Normal range of motion and neck supple.  Skin:    General: Skin is warm and dry.     Capillary Refill: Capillary refill takes less than 2 seconds.  Neurological:     General: No focal deficit present.     Mental Status: He is alert and oriented to person, place, and time.  Psychiatric:        Mood and Affect: Mood normal.        Behavior: Behavior normal.        Thought Content: Thought content normal.        Judgment: Judgment normal.     Results for orders placed or performed in visit on 03/06/16  Uric acid   Collection Time: 03/06/16 10:47 AM  Result Value Ref Range   Uric Acid  5.7 3.7 - 8.6 mg/dL      Assessment & Plan:   Problem List Items Addressed This Visit       Other   Tobacco abuse - Primary   Discussed cessation and available treatment including nicotine replacement options, pharmacologic treatment, and/or online resources. Based on our discussion, he does not plan initiate treatment today. We discussed possible initiation of tx w/ Nicotine Gum and inhaler and patches. Total time spent on discussion: 7 minutes.         Other Visit Diagnoses       Screening for colon cancer       Relevant Orders   Cologuard     Encounter to establish care            Follow up plan: Return in about 1 month (around 01/20/2024) for Physical and Fasting labs.

## 2024-01-08 DIAGNOSIS — Z20822 Contact with and (suspected) exposure to covid-19: Secondary | ICD-10-CM | POA: Diagnosis not present

## 2024-01-08 DIAGNOSIS — U071 COVID-19: Secondary | ICD-10-CM | POA: Diagnosis not present

## 2024-01-08 DIAGNOSIS — R030 Elevated blood-pressure reading, without diagnosis of hypertension: Secondary | ICD-10-CM | POA: Diagnosis not present

## 2024-01-20 LAB — COLOGUARD

## 2024-01-21 ENCOUNTER — Ambulatory Visit: Payer: Self-pay | Admitting: Nurse Practitioner

## 2024-01-21 ENCOUNTER — Ambulatory Visit: Admitting: Nurse Practitioner

## 2024-01-21 NOTE — Progress Notes (Deleted)
 There were no vitals taken for this visit.   Subjective:    Patient ID: George Woods, male    DOB: 1970-07-20, 53 y.o.   MRN: [id number]  HPI: George Woods is a 53 y.o. male presenting on 01/21/2024 for comprehensive medical examination. Current medical complaints include:{Blank single:19197::none,***}  He currently lives with: Interim Problems from his last visit: {Blank single:19197::yes,no}  SMOKING CESSATION Smoking Status: Smoking Amount: Smoking Onset:  Smoking Quit Date:  Smoking triggers: Type of tobacco use:  Children in the house: {Blank single:19197::yes,no} Other household members who smoke: {Blank single:19197::yes,no} Treatments attempted:  Pneumovax:    Depression Screen done today and results listed below:     12/20/2023    2:13 PM 12/01/2015    3:02 PM  Depression screen PHQ 2/9  Decreased Interest 0 0  Down, Depressed, Hopeless 0 0  PHQ - 2 Score 0 0  Altered sleeping 1   Tired, decreased energy 0   Change in appetite 1   Feeling bad or failure about yourself  0   Trouble concentrating 0   Moving slowly or fidgety/restless 0   Suicidal thoughts 0   PHQ-9 Score 2     The patient {has/does not yjcz:80150} a history of falls. I {did/did not:19850} complete a risk assessment for falls. A plan of care for falls {was/was not:19852} documented.   Past Medical History:  Past Medical History:  Diagnosis Date   Broken ankle     Surgical History:  No past surgical history on file.  Medications:  No current outpatient medications on file prior to visit.   No current facility-administered medications on file prior to visit.    Allergies:  Allergies  Allergen Reactions   Penicillins Rash    Social History:  Social History   Socioeconomic History   Marital status: Married    Spouse name: Not on file   Number of children: Not on file   Years of education: Not on file   Highest education level: Not on file   Occupational History   Not on file  Tobacco Use   Smoking status: Every Day    Current packs/day: 1.00    Average packs/day: 1 pack/day for 28.7 years (28.7 ttl pk-yrs)    Types: Cigarettes    Start date: 05/02/1995   Smokeless tobacco: Never  Substance and Sexual Activity   Alcohol use: Yes    Alcohol/week: 42.0 standard drinks of alcohol    Types: 42 Cans of beer per week    Comment: 6 pack a night   Drug use: Yes    Types: Marijuana   Sexual activity: Yes    Partners: Female  Other Topics Concern   Not on file  Social History Narrative   Not on file   Social Drivers of Health   Financial Resource Strain: Not on file  Food Insecurity: No Food Insecurity (12/20/2023)   Hunger Vital Sign    Worried About Running Out of Food in the Last Year: Never true    Ran Out of Food in the Last Year: Never true  Transportation Needs: Not on file  Physical Activity: Not on file  Stress: Not on file  Social Connections: Not on file  Intimate Partner Violence: Not on file   Social History   Tobacco Use  Smoking Status Every Day   Current packs/day: 1.00   Average packs/day: 1 pack/day for 28.7 years (28.7 ttl pk-yrs)   Types: Cigarettes   Start date: 05/02/1995  Smokeless Tobacco Never   Social History   Substance and Sexual Activity  Alcohol Use Yes   Alcohol/week: 42.0 standard drinks of alcohol   Types: 42 Cans of beer per week   Comment: 6 pack a night    Family History:  Family History  Problem Relation Age of Onset   Diabetes Father    Liver disease Maternal Grandfather    Alzheimer's disease Paternal Grandmother    Cancer Paternal Grandfather     Past medical history, surgical history, medications, allergies, family history and social history reviewed with patient today and changes made to appropriate areas of the chart.   ROS All other ROS negative except what is listed above and in the HPI.      Objective:    There were no vitals taken for this visit.   Wt Readings from Last 3 Encounters:  12/20/23 142 lb 6.4 oz (64.6 kg)  03/06/16 146 lb (66.2 kg)  12/01/15 147 lb 9.6 oz (67 kg)    Physical Exam  Results for orders placed or performed in visit on 12/20/23  Cologuard   Collection Time: 01/18/24 11:43 AM  Result Value Ref Range   COLOGUARD Sample Could Not Be Processed 4 N/A      Assessment & Plan:   Problem List Items Addressed This Visit   None    Discussed aspirin prophylaxis for myocardial infarction prevention and decision was {Blank single:19197::it was not indicated,made to continue ASA,made to start ASA,made to stop ASA,that we recommended ASA, and patient refused}  LABORATORY TESTING:  Health maintenance labs ordered today as discussed above.   The natural history of prostate cancer and ongoing controversy regarding screening and potential treatment outcomes of prostate cancer has been discussed with the patient. The meaning of a false positive PSA and a false negative PSA has been discussed. He indicates understanding of the limitations of this screening test and wishes *** to proceed with screening PSA testing.   IMMUNIZATIONS:   - Tdap: Tetanus vaccination status reviewed: {tetanus status:315746}. - Influenza: {Blank single:19197::Up to date,Administered today,Postponed to flu season,Refused,Given elsewhere} - Pneumovax: {Blank single:19197::Up to date,Administered today,Not applicable,Refused,Given elsewhere} - Prevnar: {Blank single:19197::Up to date,Administered today,Not applicable,Refused,Given elsewhere} - COVID: {Blank single:19197::Up to date,Administered today,Not applicable,Refused,Given elsewhere} - HPV: {Blank single:19197::Up to date,Administered today,Not applicable,Refused,Given elsewhere} - Shingrix vaccine: {Blank single:19197::Up to date,Administered today,Not applicable,Refused,Given elsewhere}  SCREENING: - Colonoscopy: {Blank  single:19197::Up to date,Ordered today,Not applicable,Refused,Done elsewhere}  Discussed with patient purpose of the colonoscopy is to detect colon cancer at curable precancerous or early stages   - AAA Screening: {Blank single:19197::Up to date,Ordered today,Not applicable,Refused,Done elsewhere}  -Hearing Test: {Blank single:19197::Up to date,Ordered today,Not applicable,Refused,Done elsewhere}  -Spirometry: {Blank single:19197::Up to date,Ordered today,Not applicable,Refused,Done elsewhere}   PATIENT COUNSELING:    Sexuality: Discussed sexually transmitted diseases, partner selection, use of condoms, avoidance of unintended pregnancy  and contraceptive alternatives.   Advised to avoid cigarette smoking.  I discussed with the patient that most people either abstain from alcohol or drink within safe limits (<=14/week and <=4 drinks/occasion for males, <=7/weeks and <= 3 drinks/occasion for females) and that the risk for alcohol disorders and other health effects rises proportionally with the number of drinks per week and how often a drinker exceeds daily limits.  Discussed cessation/primary prevention of drug use and availability of treatment for abuse.   Diet: Encouraged to adjust caloric intake to maintain  or achieve ideal body weight, to reduce intake of dietary saturated fat and total fat, to limit sodium  intake by avoiding high sodium foods and not adding table salt, and to maintain adequate dietary potassium and calcium preferably from fresh fruits, vegetables, and low-fat dairy products.    stressed the importance of regular exercise  Injury prevention: Discussed safety belts, safety helmets, smoke detector, smoking near bedding or upholstery.   Dental health: Discussed importance of regular tooth brushing, flossing, and dental visits.   Follow up plan: NEXT PREVENTATIVE PHYSICAL DUE IN 1 YEAR. No follow-ups on file.
